# Patient Record
Sex: Male | Born: 1990
Health system: Southern US, Community
[De-identification: ages and names within clinical notes are randomized; demographics above are authoritative.]

---

## 2013-02-06 ENCOUNTER — Emergency Department (HOSPITAL_COMMUNITY)
Admission: EM | Admit: 2013-02-06 | Discharge: 2013-02-06 | Disposition: A | Discharge status: Home / Self Care | Payer: BC Managed Care – PPO | Attending: Emergency Medicine | Admitting: Emergency Medicine

## 2013-02-06 ENCOUNTER — Encounter (HOSPITAL_COMMUNITY): Payer: Self-pay | Admitting: *Deleted

## 2013-02-06 DIAGNOSIS — H18821 Corneal disorder due to contact lens, right eye: Secondary | ICD-10-CM

## 2013-02-06 DIAGNOSIS — Y939 Activity, unspecified: Secondary | ICD-10-CM | POA: Insufficient documentation

## 2013-02-06 DIAGNOSIS — S058X9A Other injuries of unspecified eye and orbit, initial encounter: Secondary | ICD-10-CM | POA: Insufficient documentation

## 2013-02-06 DIAGNOSIS — X58XXXA Exposure to other specified factors, initial encounter: Secondary | ICD-10-CM | POA: Insufficient documentation

## 2013-02-06 DIAGNOSIS — Y929 Unspecified place or not applicable: Secondary | ICD-10-CM | POA: Insufficient documentation

## 2013-02-06 MED ORDER — TETRACAINE HCL 0.5 % OP SOLN
2.0000 [drp] | Freq: Once | OPHTHALMIC | Status: AC
  Administered 2013-02-06: 2 [drp] via OPHTHALMIC
  Filled 2013-02-06: qty 2

## 2013-02-06 MED ORDER — MOXIFLOXACIN HCL 0.5 % OP SOLN: 1.0000 [drp] | OPHTHALMIC | Status: DC

## 2013-02-06 MED ORDER — FLUORESCEIN SODIUM 1 MG OP STRP
1.0000 | ORAL_STRIP | Freq: Once | OPHTHALMIC | Status: AC
  Administered 2013-02-06: 1 via OPHTHALMIC
  Filled 2013-02-06: qty 1

## 2013-02-06 NOTE — ED Provider Notes (Signed)
CSN: [id number]     Arrival date & time 02/06/13  1612 History  This chart was scribed for non-physician practitioner Dierdre Forth, PA-C working with Candyce Churn, MD by Danella Maiers, ED Scribe. This patient was seen in room TR04C/TR04C and the patient's care was started at 5:08 PM.    Chief Complaint  Patient presents with  . Conjunctivitis   The history is provided by the patient. No language interpreter was used.   HPI Comments: Marcus Hanson is a 22 y.o. male who presents to the Emergency Department complaining of redness and clear, watery discharge from the right eye that started yesterday. He also reports mild itchiness without tenderness. He reports two episodes of clear discharge. He states it was a little bit crusty this morning but not glued shut. He has never had conjunctivitis before. He has not tried any medications. He notes he does keep his contacts in for prolonged periods and sleeps in them even though he is advised to take them out. He denies any scratchy pain as if something is stuck in the eye. He denies visual disturbance, photophobia, eye pain, nausea, emesis, fever, chills. He denies recent cold sores. He has denies any injuries. He denies anyone in the house having conjunctivitis. The last time he saw his eye doctor, Dr. Laney Pastor, in [place] was in March. He is planning to go in the next week.    History reviewed. No pertinent past medical history. History reviewed. No pertinent past surgical history. No family history on file. History  Substance Use Topics  . Smoking status: Never Smoker   . Smokeless tobacco: Not on file  . Alcohol Use: No    Review of Systems  Constitutional: Negative for fever, diaphoresis, appetite change, fatigue and unexpected weight change.  HENT: Negative for mouth sores and neck stiffness.   Eyes: Positive for discharge and redness. Negative for photophobia, pain and visual disturbance.  Respiratory: Negative for cough,  chest tightness, shortness of breath and wheezing.   Cardiovascular: Negative for chest pain.  Gastrointestinal: Negative for nausea, vomiting, abdominal pain, diarrhea and constipation.  Endocrine: Negative for polydipsia, polyphagia and polyuria.  Genitourinary: Negative for dysuria, urgency, frequency and hematuria.  Musculoskeletal: Negative for back pain.  Skin: Negative for rash.  Allergic/Immunologic: Negative for immunocompromised state.  Neurological: Negative for syncope, light-headedness and headaches.  Hematological: Does not bruise/bleed easily.  Psychiatric/Behavioral: Negative for sleep disturbance. The patient is not nervous/anxious.     Allergies  Review of patient's allergies indicates no known allergies.  Home Medications   Current Outpatient Rx  Name  Route  Sig  Dispense  Refill  . moxifloxacin (VIGAMOX) 0.5 % ophthalmic solution   Right Eye   Place 1 drop into the right eye See admin instructions. Instill 1-2 drops every 2 hours while awake for 2 days THEN every 4-8 hours for 5 days   3 mL   0    BP 133/88  Pulse 77  Temp(Src) 98.7 F (37.1 C) (Oral)  Resp 16  SpO2 98% Physical Exam  Nursing note and vitals reviewed. Constitutional: He is oriented to person, place, and time. He appears well-developed and well-nourished. No distress.  Awake, alert, nontoxic appearance  HENT:  Head: Normocephalic and atraumatic.  Mouth/Throat: Oropharynx is clear and moist. No oropharyngeal exudate.  Eyes: EOM and lids are normal. Pupils are equal, round, and reactive to light. Lids are everted and swept, no foreign bodies found. Right eye exhibits no chemosis, no discharge, no exudate  and no hordeolum. No foreign body present in the right eye. Left eye exhibits no chemosis, no discharge, no exudate and no hordeolum. No foreign body present in the left eye. Right conjunctiva is injected. Right conjunctiva has no hemorrhage. Left conjunctiva is not injected. Left conjunctiva  has no hemorrhage. No scleral icterus. Right eye exhibits normal extraocular motion and no nystagmus. Left eye exhibits normal extraocular motion and no nystagmus.  Fundoscopic exam:      The right eye shows no arteriolar narrowing, no AV nicking, no exudate, no hemorrhage and no papilledema.       The left eye shows no arteriolar narrowing, no AV nicking, no exudate, no hemorrhage and no papilledema.  Slit lamp exam:      The right eye shows corneal abrasion and fluorescein uptake. The right eye shows no corneal flare, no corneal ulcer, no foreign body and no anterior chamber bulge.       The left eye shows no corneal abrasion, no corneal flare, no corneal ulcer, no foreign body, no fluorescein uptake and no anterior chamber bulge.    Currently no discharge from either eye. Right eye is injected. Small corneal abrasion to the right eye  Visual Acuity without contacts or glasses: Both eye - 20/20 Right eye - 20/40 Left eye - 20/25  Neck: Normal range of motion and full passive range of motion without pain. No spinous process tenderness and no muscular tenderness present. No rigidity. Normal range of motion present.  No midline tenderness, no nuchal rigidity  Cardiovascular: Normal rate, regular rhythm, normal heart sounds and intact distal pulses.   No murmur heard. Pulmonary/Chest: Effort normal and breath sounds normal. No respiratory distress. He has no decreased breath sounds. He has no wheezes.  Abdominal: Soft. Bowel sounds are normal. He exhibits no mass. There is no tenderness. There is no rebound and no guarding.  Musculoskeletal: Normal range of motion. He exhibits no edema.  Lymphadenopathy:       Head (right side): No submental, no submandibular, no tonsillar, no preauricular, no posterior auricular and no occipital adenopathy present.       Head (left side): No submental, no submandibular, no tonsillar, no preauricular, no posterior auricular and no occipital adenopathy present.        Right cervical: No superficial cervical, no deep cervical and no posterior cervical adenopathy present.      Left cervical: No superficial cervical, no deep cervical and no posterior cervical adenopathy present.  Neurological: He is alert and oriented to person, place, and time. No cranial nerve deficit.  Speech is clear and goal oriented Moves extremities without ataxia Cranial nerves 2 through 12 grossly intact  Skin: Skin is warm and dry. No rash noted. He is not diaphoretic. No erythema.  No erythema surrounding the right eye No periorbital edema  Psychiatric: He has a normal mood and affect.    ED Course  Procedures (including critical care time) Medications  tetracaine (PONTOCAINE) 0.5 % ophthalmic solution 2 drop (2 drops Right Eye Given 02/06/13 1735)  fluorescein ophthalmic strip 1 strip (1 strip Both Eyes Given 02/06/13 1735)   DIAGNOSTIC STUDIES: Oxygen Saturation is 98% on RA, normal by my interpretation.    COORDINATION OF CARE: 5:49 PM- Discussed treatment plan with pt which includes referral to ophthalmologist and pt agrees to plan.   Labs Review Labs Reviewed - No data to display Imaging Review No results found.  MDM   1. Corneal abrasion of right eye due to contact  lens       Tymir Terral presents with corneal abrasion 2/2 contact use.  Pt with corneal abrasion on PE. Eye irrigated w NS, no evidence of FB.  No change in vision, acuity equal bilaterally.  Pt is a contact lens wearer and this is likely the source of his abrasion given the location.  Exam non-concerning for orbital cellulitis, hyphema, corneal ulcers. Patient will be discharged home with moxifloxacin opthalmic.   Patient understands to follow up with ophthalmology, & to return to ER if new symptoms develop including change in vision, purulent drainage, or entrapment.  It has been determined that no acute conditions requiring further emergency intervention are present at this time. The  patient/guardian have been advised of the diagnosis and plan. We have discussed signs and symptoms that warrant return to the ED, such as changes or worsening in symptoms.   Vital signs are stable at discharge.   BP 138/90  Pulse 75  Temp(Src) 98.7 F (37.1 C) (Oral)  Resp 16  SpO2 100%  Patient/guardian has voiced understanding and agreed to follow-up with the PCP or specialist.    I personally performed the services described in this documentation, which was scribed in my presence. The recorded information has been reviewed and is accurate.    Dierdre Forth, PA-C 02/06/13 1858

## 2013-02-06 NOTE — ED Notes (Signed)
Visual acuity screening: Both eye - 20/20 Right eye - 20/40 Left eye - 20/25

## 2013-02-06 NOTE — ED Notes (Signed)
Pt with redness, yellow discharge and some pain to R eye

## 2013-02-07 NOTE — ED Provider Notes (Signed)
Medical screening examination/treatment/procedure(s) were performed by non-physician practitioner and as supervising physician I was immediately available for consultation/collaboration.    Candyce Churn, MD 02/07/13 [phone]

## 2013-09-19 ENCOUNTER — Encounter (HOSPITAL_COMMUNITY): Payer: Self-pay | Admitting: Emergency Medicine

## 2013-09-19 ENCOUNTER — Ambulatory Visit: Payer: 59

## 2013-09-19 ENCOUNTER — Emergency Department (HOSPITAL_COMMUNITY)
Admission: EM | Admit: 2013-09-19 | Discharge: 2013-09-19 | Disposition: A | Discharge status: Home / Self Care | Payer: 59 | Attending: Emergency Medicine | Admitting: Emergency Medicine

## 2013-09-19 DIAGNOSIS — R599 Enlarged lymph nodes, unspecified: Secondary | ICD-10-CM | POA: Insufficient documentation

## 2013-09-19 DIAGNOSIS — L089 Local infection of the skin and subcutaneous tissue, unspecified: Secondary | ICD-10-CM | POA: Insufficient documentation

## 2013-09-19 MED ORDER — TERBINAFINE HCL 1 % EX CREA: 1.0000 | TOPICAL_CREAM | Freq: Two times a day (BID) | CUTANEOUS | Status: DC

## 2013-09-19 MED ORDER — CEPHALEXIN 500 MG PO CAPS: 500.0000 mg | ORAL_CAPSULE | Freq: Three times a day (TID) | ORAL | Status: DC

## 2013-09-19 NOTE — ED Provider Notes (Signed)
CSN: [id number]     Arrival date & time 09/19/13  1144 History  This chart was scribed for non-physician practitioner, Arthor CaptainAbigail Faiga Stones, PA-C working with Doug SouSam Jacubowitz, MD by Greggory StallionKayla Andersen, ED scribe. This patient was seen in room TR09C/TR09C and the patient's care was started at 1:11 PM.   Chief Complaint  Patient presents with  . Otalgia   The history is provided by the patient. No language interpreter was used.   HPI Comments: Marcus Hanson is a 23 y.o. male who presents to the Emergency Department complaining of a knot behind his right ear that started about one month ago. States it has been very dry so he has been using Vasolin on it with little relief. The pain is radiating into his ear and it is sometimes itchy. He has noticed some drainage that worsened a few days ago. Denies fever, hearing changes, neck pain.   History reviewed. No pertinent past medical history. History reviewed. No pertinent past surgical history. History reviewed. No pertinent family history. History  Substance Use Topics  . Smoking status: Never Smoker   . Smokeless tobacco: Not on file  . Alcohol Use: Yes    Review of Systems  Constitutional: Negative for fever.  HENT: Positive for ear pain. Negative for hearing loss.   Eyes: Negative for redness.  Respiratory: Negative for shortness of breath.   Cardiovascular: Negative for chest pain.  Gastrointestinal: Negative for abdominal distention.  Musculoskeletal: Negative for neck pain.  Skin: Negative for rash.  Neurological: Negative for speech difficulty.  Psychiatric/Behavioral: Negative for confusion.   Allergies  Review of patient's allergies indicates no known allergies.  Home Medications   Prior to Admission medications   Medication Sig Start Date End Date Taking? Authorizing Provider  moxifloxacin (VIGAMOX) 0.5 % ophthalmic solution Place 1 drop into the right eye See admin instructions. Instill 1-2 drops every 2 hours while awake for 2 days  THEN every 4-8 hours for 5 days 02/06/13   Dahlia ClientHannah Muthersbaugh, PA-C   BP 142/78  Pulse 83  Temp(Src) 98.2 F (36.8 C) (Oral)  Resp 18  SpO2 100%  Physical Exam  Nursing note and vitals reviewed. Constitutional: He is oriented to person, place, and time. He appears well-developed and well-nourished. No distress.  HENT:  Head: Normocephalic and atraumatic.  Thick, yellow crusting and scaling on posterior right ear.  Eyes: EOM are normal.  Neck: Neck supple. No tracheal deviation present.  Cardiovascular: Normal rate.   Pulmonary/Chest: Effort normal. No respiratory distress.  Musculoskeletal: Normal range of motion.  Lymphadenopathy:       Head (left side): Posterior auricular adenopathy present.  Neurological: He is alert and oriented to person, place, and time.  Skin: Skin is warm and dry.  Psychiatric: He has a normal mood and affect. His behavior is normal.    ED Course  Procedures (including critical care time)  DIAGNOSTIC STUDIES: Oxygen Saturation is 100% on RA, normal by my interpretation.    COORDINATION OF CARE: 1:14 PM-Discussed treatment plan  with pt at bedside and pt agreed to plan.   Labs Review Labs Reviewed - No data to display  Imaging Review No results found.   EKG Interpretation None      MDM   Final diagnoses:  Skin infection    Patient with weeping skin infection posterior to R ear. I  believe the infection as fungal however question secondary bacterial infection with possible impetigo. Plan patient will be discharged today with Lamisil and Keflex for infection.  Given the patient was prescribed for followup. No signs of mastoiditis no acute otitis media or other signs of infection.  I personally performed the services described in this documentation, which was scribed in my presence. The recorded information has been reviewed and is accurate.  Arthor CaptainAbigail Zenia Guest, PA-C 09/19/13 1443

## 2013-09-19 NOTE — ED Provider Notes (Signed)
Medical screening examination/treatment/procedure(s) were performed by non-physician practitioner and as supervising physician I was immediately available for consultation/collaboration.   EKG Interpretation None       Albeiro Trompeter, MD 09/19/13 1626 

## 2013-09-19 NOTE — ED Notes (Signed)
Pt reports that he has had problems with drainage from a knot behind his right ear. Pt states that over the past couple of the drainage has become worse.

## 2013-09-19 NOTE — Discharge Instructions (Signed)
Candida Infection, Adult A candida infection (also called yeast, fungus and Monilia infection) is an overgrowth of yeast that can occur anywhere on the body. A yeast infection commonly occurs in warm, moist body areas. Usually, the infection remains localized but can spread to become a systemic infection. A yeast infection may be a sign of a more severe disease such as diabetes, leukemia, or AIDS. A yeast infection can occur in both men and women. In women, Candida vaginitis is a vaginal infection. It is one of the most common causes of vaginitis. Men usually do not have symptoms or know they have an infection until other problems develop. Men may find out they have a yeast infection because their sex partner has a yeast infection. Uncircumcised men are more likely to get a yeast infection than circumcised men. This is because the uncircumcised glans is not exposed to air and does not remain as dry as that of a circumcised glans. Older adults may develop yeast infections around dentures. CAUSES  Women  Antibiotics.  Steroid medication taken for a long time.  Being overweight (obese).  Diabetes.  Poor immune condition.  Certain serious medical conditions.  Immune suppressive medications for organ transplant patients.  Chemotherapy.  Pregnancy.  Menstration.  Stress and fatigue.  Intravenous drug use.  Oral contraceptives.  Wearing tight-fitting clothes in the crotch area.  Catching it from a sex partner who has a yeast infection.  Spermicide.  Intravenous, urinary, or other catheters. Men  Catching it from a sex partner who has a yeast infection.  Having oral or anal sex with a person who has the infection.  Spermicide.  Diabetes.  Antibiotics.  Poor immune system.  Medications that suppress the immune system.  Intravenous drug use.  Intravenous, urinary, or other catheters. SYMPTOMS  Women  Thick, white vaginal discharge.  Vaginal itching.  Redness and  swelling in and around the vagina.  Irritation of the lips of the vagina and perineum.  Blisters on the vaginal lips and perineum.  Painful sexual intercourse.  Low blood sugar (hypoglycemia).  Painful urination.  Bladder infections.  Intestinal problems such as constipation, indigestion, bad breath, bloating, increase in gas, diarrhea, or loose stools. Men  Men may develop intestinal problems such as constipation, indigestion, bad breath, bloating, increase in gas, diarrhea, or loose stools.  Dry, cracked skin on the penis with itching or discomfort.  Jock itch.  Dry, flaky skin.  Athlete's foot.  Hypoglycemia. DIAGNOSIS  Women  A history and an exam are performed.  The discharge may be examined under a microscope.  A culture may be taken of the discharge. Men  A history and an exam are performed.  Any discharge from the penis or areas of cracked skin will be looked at under the microscope and cultured.  Stool samples may be cultured. TREATMENT  Women  Vaginal antifungal suppositories and creams.  Medicated creams to decrease irritation and itching on the outside of the vagina.  Warm compresses to the perineal area to decrease swelling and discomfort.  Oral antifungal medications.  Medicated vaginal suppositories or cream for repeated or recurrent infections.  Wash and dry the irritation areas before applying the cream.  Eating yogurt with lactobacillus may help with prevention and treatment.  Sometimes painting the vagina with gentian violet solution may help if creams and suppositories do not work. Men  Antifungal creams and oral antifungal medications.  Sometimes treatment must continue for 30 days after the symptoms go away to prevent recurrence. HOME CARE   INSTRUCTIONS  Women  Use cotton underwear and avoid tight-fitting clothing.  Avoid colored, scented toilet paper and deodorant tampons or pads.  Do not douche.  Keep your diabetes  under control.  Finish all the prescribed medications.  Keep your skin clean and dry.  Consume milk or yogurt with lactobacillus active culture regularly. If you get frequent yeast infections and think that is what the infection is, there are over-the-counter medications that you can get. If the infection does not show healing in 3 days, talk to your caregiver.  Tell your sex partner you have a yeast infection. Your partner may need treatment also, especially if your infection does not clear up or recurs. Men  Keep your skin clean and dry.  Keep your diabetes under control.  Finish all prescribed medications.  Tell your sex partner that you have a yeast infection so they can be treated if necessary. SEEK MEDICAL CARE IF:   Your symptoms do not clear up or worsen in one week after treatment.  You have an oral temperature above 102 F (38.9 C).  You have trouble swallowing or eating for a prolonged time.  You develop blisters on and around your vagina.  You develop vaginal bleeding and it is not your menstrual period.  You develop abdominal pain.  You develop intestinal problems as mentioned above.  You get weak or lightheaded.  You have painful or increased urination.  You have pain during sexual intercourse. MAKE SURE YOU:   Understand these instructions.  Will watch your condition.  Will get help right away if you are not doing well or get worse. Document Released: 06/01/2004 Document Revised: 07/17/2011 Document Reviewed: 09/13/2009 [facility] CenterExitCare Patient Information 2014 [place], [place].  Impetigo Impetigo is an infection of the skin, most common in babies and children.  CAUSES  It is caused by staphylococcal or streptococcal germs (bacteria). Impetigo can start after any damage to the skin. The damage to the skin may be from things like:   Chickenpox.  Scrapes.  Scratches.  Insect bites (common when children scratch the bite).  Cuts.  Nail biting or  chewing. Impetigo is contagious. It can be spread from one person to another. Avoid close skin contact, or sharing towels or clothing. SYMPTOMS  Impetigo usually starts out as small blisters or pustules. Then they turn into tiny yellow-crusted sores (lesions).  There may also be:  Large blisters.  Itching or pain.  Pus.  Swollen lymph glands. With scratching, irritation, or non-treatment, these small areas may get larger. Scratching can cause the germs to get under the fingernails; then scratching another part of the skin can cause the infection to be spread there. DIAGNOSIS  Diagnosis of impetigo is usually made by a physical exam. A skin culture (test to grow bacteria) may be done to prove the diagnosis or to help decide the best treatment.  TREATMENT  Mild impetigo can be treated with prescription antibiotic cream. Oral antibiotic medicine may be used in more severe cases. Medicines for itching may be used. HOME CARE INSTRUCTIONS   To avoid spreading impetigo to other body areas:  Keep fingernails short and clean.  Avoid scratching.  Cover infected areas if necessary to keep from scratching.  Gently wash the infected areas with antibiotic soap and water.  Soak crusted areas in warm soapy water using antibiotic soap.  Gently rub the areas to remove crusts. Do not scrub.  Wash hands often to avoid spread this infection.  Keep children with impetigo home from school  or daycare until they have used an antibiotic cream for 48 hours (2 days) or oral antibiotic medicine for 24 hours (1 day), and their skin shows significant improvement.  Children may attend school or daycare if they only have a few sores and if the sores can be covered by a bandage or clothing. SEEK MEDICAL CARE IF:   More blisters or sores show up despite treatment.  Other family members get sores.  Rash is not improving after 48 hours (2 days) of treatment. SEEK IMMEDIATE MEDICAL CARE IF:   You see  spreading redness or swelling of the skin around the sores.  You see red streaks coming from the sores.  Your child develops a fever of 100.4 F (37.2 C) or higher.  Your child develops a sore throat.  Your child is acting ill (lethargic, sick to their stomach). Document Released: 04/21/2000 Document Revised: 07/17/2011 Document Reviewed: 02/19/2008 [facility] LPExitCare Patient Information 2014 [place]ExitCare, [place]LL[place].

## 2016-02-02 ENCOUNTER — Ambulatory Visit (INDEPENDENT_AMBULATORY_CARE_PROVIDER_SITE_OTHER): Payer: 59 | Admitting: Family Medicine

## 2016-02-02 VITALS — BP 136/87 | HR 98 | Temp 98.8°F | Resp 16 | Ht 65.0 in | Wt 219.0 lb

## 2016-02-02 DIAGNOSIS — Z0000 Encounter for general adult medical examination without abnormal findings: Secondary | ICD-10-CM

## 2016-02-02 DIAGNOSIS — Z23 Encounter for immunization: Secondary | ICD-10-CM | POA: Diagnosis not present

## 2016-02-02 NOTE — Patient Instructions (Signed)
It was good to meet you today.   Sign up for Mychart when you get home and you will have access to your labs.  LDL = cholesterol A1C = blood sugar TSH = thyroid (concerns for weight) Your BMI is 36.   Your blood pressure is 136/87.

## 2016-02-02 NOTE — Progress Notes (Signed)
    Marcus Hanson is a 25 y.o. male who presents to [facility] today for comprehensive physical examination:  CPE:  Patient here for complete physical examination. He has forms that he needs filled out for work. He needs to have an LDL, A1c, BMI for work.  Concerns:  None, doing well.   Patient originally from [place]. Now lives and works in [place]. Has been working for [employer] which is an [employer] company for the past 2 years.  Family history hypertension and diabetes is grandparents both maternally and paternally. Otherwise negative family history.  He drinks socially 1-2 drinks a week. Otherwise no cigarettes or illicit drug use. He is single and not currently sexually active. No history or concern for STDs.  Last physical never.  Last Tetanus more than 10 years ago.   Flu vaccine never. Eye exam never.;  No glasses. Dental exam every six months.  ROS:  The patient denies fever, unusual weight change, decreased hearing, chest pain, palpitations, pre-syncopal or syncopal episodes, dyspnea on exertion, prolonged cough, hemoptysis, change in bowel habits, melena, hematochezia, severe indigestion/heartburn, nausea/vomiting/abdominal pain, genital sores, muscle weakness, difficulty walking, abnormal bleeding, or enlarged lymph nodes.     PMH reviewed. Patient is a nonsmoker.   History reviewed. No pertinent past medical history. History reviewed. No pertinent surgical history.  Medications reviewed. No current outpatient prescriptions on file.   No current facility-administered medications for this visit.     Exam: BP 136/87 (BP Location: Right Arm, Patient Position: Sitting, Cuff Size: Normal)   Pulse 98   Temp 98.8 F (37.1 C) (Oral)   Resp 16   Ht 5\' 5"  (1.651 m)   Wt 219 lb (99.3 kg)   SpO2 98%   BMI 36.44 kg/m  Gen:  Alert, cooperative patient who appears stated age in no acute distress.  Vital signs reviewed. Head: [place]/AT.   Eyes:   EOMI, PERRL.   Ears:  External ears WNL, Bilateral TM's normal without retraction, redness or bulging. Nose:  Septum midline  Mouth:  MMM, tonsils non-erythematous, non-edematous.   Neck: No masses or thyromegaly or limitation in range of motion.  No cervical lymphadenopathy. Pulm:  Clear to auscultation bilaterally with good air movement.  No wheezes or rales noted.   Cardiac:  Regular rate and rhythm without murmur auscultated.  Good S1/S2. Abd:  Soft/nondistended/nontender.  Good bowel sounds throughout all four quadrants.  No masses noted.  Ext:  No clubbing/cyanosis/erythema.  No edema noted bilateral lower extremities.   Neuro:  Grossly normal, no gait abnormalities Psych:  Not depressed or anxious appearing.  Conversant and engaged  Impression/Plan: 1. Complete Physical Examination: anticipatory guidance provided.  S/p TDAP.   2.  STD screening/high risk sexual behavior: counseling provided; obtain GC/Chlam/RPR/HIV. 3.  Vaccines:  S/p TDAP 4.  Screening cholesterol: obtain FLP. 5.  Overweight:  Discussed with patient.  He feels this is come on fairly suddenly in the past year. Does report sedentary lifestyle. Also checking TSH for weight gain.  Believe this is likely NSAID secondary to sedentary lifestyle and increased caloric intake. We'll call with results.

## 2016-02-03 LAB — TSH: TSH: 1.79 m[IU]/L (ref 0.40–4.50)

## 2016-02-03 LAB — HEMOGLOBIN A1C
Hgb A1c MFr Bld: 5.3 % (ref <5.7)
MEAN PLASMA GLUCOSE: 105 mg/dL

## 2016-02-03 LAB — LDL CHOLESTEROL, DIRECT: LDL DIRECT: 174 mg/dL — AB (ref <130)

## 2016-02-08 ENCOUNTER — Telehealth: Payer: Self-pay

## 2016-02-14 NOTE — Telephone Encounter (Signed)
There is no content to this message. Not sure if opened in error, or if pt tried to call us. LMOM for pt to CB if he was trying to reach us and I will be happy to help him.

## 2017-08-27 ENCOUNTER — Encounter (HOSPITAL_COMMUNITY): Payer: Self-pay

## 2017-08-27 ENCOUNTER — Emergency Department (HOSPITAL_COMMUNITY)
Admission: EM | Admit: 2017-08-27 | Discharge: 2017-08-27 | Disposition: A | Discharge status: Home / Self Care | Payer: 59 | Attending: Emergency Medicine | Admitting: Emergency Medicine

## 2017-08-27 DIAGNOSIS — H10022 Other mucopurulent conjunctivitis, left eye: Secondary | ICD-10-CM | POA: Diagnosis not present

## 2017-08-27 DIAGNOSIS — H5789 Other specified disorders of eye and adnexa: Secondary | ICD-10-CM | POA: Diagnosis present

## 2017-08-27 DIAGNOSIS — H1032 Unspecified acute conjunctivitis, left eye: Secondary | ICD-10-CM

## 2017-08-27 MED ORDER — LEVOFLOXACIN 0.5 % OP SOLN: 1.0000 [drp] | Freq: Four times a day (QID) | OPHTHALMIC | 0 refills | Status: AC

## 2017-08-27 NOTE — ED Provider Notes (Signed)
[facility] EMERGENC[facility] Provider Note   CSN: [id number] Arrival date & time: 08/27/17  [id number]     History   Chief Complaint Chief Complaint  Patient presents with  . Conjunctivitis    HPI Marcus KnifeDennis J Iwan Jr. is a 27 y.o. male.  HPI  Patient presents to the emergency department with left eye redness and drainage since Saturday.  The patient states that he does wear contact lenses and noticed on Friday evening that his eyes felt a little irritated on the left.  He states that he is not worn the contacts since then.  Patient states that nothing seems to make the condition better or worse.  Patient denies any headache blurred vision nausea vomiting weakness dizziness or syncope.    History reviewed. No pertinent past medical history.Patient presents to the emergency department with  There are no active problems to display for this patient.   History reviewed. No pertinent surgical history.      Home Medications    Prior to Admission medications   Not on File    Family History No family history on file.  Social History Social History   Tobacco Use  . Smoking status: Never Smoker  . Smokeless tobacco: Never Used  Substance Use Topics  . Alcohol use: Yes  . Drug use: No     Allergies   Patient has no known allergies.   Review of Systems Review of Systems All other systems negative except as documented in the HPI. All pertinent positives and negatives as reviewed in the HPI.  Physical Exam Updated Vital Signs BP (!) 149/98 (BP Location: Right Arm)   Pulse 72   Temp 98.2 F (36.8 C) (Oral)   Resp 16   Ht 5\' 6"  (1.676 m)   Wt 85.7 kg (189 lb)   SpO2 100%   BMI 30.51 kg/m   Physical Exam  Constitutional: He is oriented to person, place, and time. He appears well-developed and well-nourished. No distress.  HENT:  Head: Normocephalic and atraumatic.  Eyes: Pupils are equal, round, and reactive to light. EOM and lids are  normal. Lids are everted and swept, no foreign bodies found. Left eye exhibits no discharge and no exudate. No foreign body present in the left eye. Left conjunctiva is injected. Left conjunctiva has no hemorrhage.  Pulmonary/Chest: Effort normal.  Neurological: He is alert and oriented to person, place, and time.  Skin: Skin is warm and dry.  Psychiatric: He has a normal mood and affect.  Nursing note and vitals reviewed.    ED Treatments / Results  Labs (all labs ordered are listed, but only abnormal results are displayed) Labs Reviewed - No data to display  EKG None  Radiology No results found.  Procedures Procedures (including critical care time)  Medications Ordered in ED Medications - No data to display   Initial Impression / Assessment and Plan / ED Course  I have reviewed the triage vital signs and the nursing notes.  Pertinent labs & imaging results that were available during my care of the patient were reviewed by me and considered in my medical decision making (see chart for details).     Patient states he does have an eye doctor's appointment at the end of the week and I advised him that I would call them to see if they can see him any sooner.  If not I feel that this would be sufficient for follow-up.  I did advise him to return for  any worsening in his condition patient agrees the plan and all questions were answered.  I advised the patient to throw out the contact lenses that he was last wearing.  And I told him not to wear contacts until his eyes completely cleared.  Final Clinical Impressions(s) / ED Diagnoses   Final diagnoses:  None    ED Discharge Orders    None       Charlestine Night, PA-C 08/27/17 0900    Arby Barrette, MD 09/03/17 1009

## 2017-08-27 NOTE — ED Triage Notes (Signed)
Pt reports left eye redness, drainage, crust since Saturday. Denies pain or visual changes. Endorses itching.

## 2017-08-27 NOTE — Discharge Instructions (Addendum)
Follow-up with your ophthalmologist as soon as possible.  Return here for any worsening in your condition. finish the full 7 days of drops.

## 2017-12-23 ENCOUNTER — Emergency Department (HOSPITAL_COMMUNITY): Payer: No Typology Code available for payment source

## 2017-12-23 ENCOUNTER — Encounter (HOSPITAL_COMMUNITY): Payer: Self-pay | Admitting: Nurse Practitioner

## 2017-12-23 ENCOUNTER — Emergency Department (HOSPITAL_COMMUNITY)
Admission: EM | Admit: 2017-12-23 | Discharge: 2017-12-24 | Disposition: A | Discharge status: Home / Self Care | Payer: No Typology Code available for payment source | Attending: Emergency Medicine | Admitting: Emergency Medicine

## 2017-12-23 DIAGNOSIS — Y999 Unspecified external cause status: Secondary | ICD-10-CM | POA: Diagnosis not present

## 2017-12-23 DIAGNOSIS — M25512 Pain in left shoulder: Secondary | ICD-10-CM

## 2017-12-23 DIAGNOSIS — Y9389 Activity, other specified: Secondary | ICD-10-CM | POA: Diagnosis not present

## 2017-12-23 DIAGNOSIS — S4992XA Unspecified injury of left shoulder and upper arm, initial encounter: Secondary | ICD-10-CM | POA: Diagnosis present

## 2017-12-23 DIAGNOSIS — G8911 Acute pain due to trauma: Secondary | ICD-10-CM | POA: Diagnosis not present

## 2017-12-23 DIAGNOSIS — I10 Essential (primary) hypertension: Secondary | ICD-10-CM | POA: Diagnosis not present

## 2017-12-23 DIAGNOSIS — Y92414 Local residential or business street as the place of occurrence of the external cause: Secondary | ICD-10-CM | POA: Diagnosis not present

## 2017-12-23 MED ORDER — IBUPROFEN 800 MG PO TABS
800.0000 mg | ORAL_TABLET | Freq: Once | ORAL | Status: AC
  Administered 2017-12-23: 800 mg via ORAL
  Filled 2017-12-23: qty 1

## 2017-12-23 MED ORDER — METHOCARBAMOL 500 MG PO TABS
500.0000 mg | ORAL_TABLET | Freq: Once | ORAL | Status: AC
  Administered 2017-12-23: 500 mg via ORAL
  Filled 2017-12-23: qty 1
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
## 2017-12-24 DIAGNOSIS — M25512 Pain in left shoulder: Secondary | ICD-10-CM | POA: Diagnosis not present

## 2017-12-24 MED ORDER — DIPHENHYDRAMINE HCL 25 MG PO CAPS
ORAL_CAPSULE | ORAL | Status: AC
  Administered 2017-12-24: 50 mg via ORAL
  Filled 2017-12-24: qty 2

## 2017-12-24 MED ORDER — METHOCARBAMOL 500 MG PO TABS: 500.0000 mg | ORAL_TABLET | Freq: Two times a day (BID) | ORAL | 0 refills | Status: DC

## 2017-12-24 MED ORDER — DIPHENHYDRAMINE HCL 25 MG PO CAPS
50.0000 mg | ORAL_CAPSULE | Freq: Once | ORAL | Status: AC
  Administered 2017-12-24: 50 mg via ORAL

## 2017-12-24 MED ORDER — NAPROXEN 500 MG PO TABS: 500.0000 mg | ORAL_TABLET | Freq: Two times a day (BID) | ORAL | 0 refills | Status: DC

## 2017-12-24 NOTE — Discharge Instructions (Addendum)

## 2018-04-25 ENCOUNTER — Emergency Department (HOSPITAL_COMMUNITY)
Admission: EM | Admit: 2018-04-25 | Discharge: 2018-04-25 | Disposition: A | Discharge status: Home / Self Care | Payer: 59 | Attending: Emergency Medicine | Admitting: Emergency Medicine

## 2018-04-25 ENCOUNTER — Encounter (HOSPITAL_COMMUNITY): Payer: Self-pay | Admitting: *Deleted

## 2018-04-25 ENCOUNTER — Other Ambulatory Visit: Payer: Self-pay

## 2018-04-25 ENCOUNTER — Emergency Department (HOSPITAL_COMMUNITY): Payer: 59

## 2018-04-25 DIAGNOSIS — R0789 Other chest pain: Secondary | ICD-10-CM | POA: Insufficient documentation

## 2018-04-25 MED ORDER — METHOCARBAMOL 500 MG PO TABS: 500.0000 mg | ORAL_TABLET | Freq: Two times a day (BID) | ORAL | 0 refills | Status: AC

## 2018-04-25 MED ORDER — IBUPROFEN 600 MG PO TABS: 600.0000 mg | ORAL_TABLET | Freq: Four times a day (QID) | ORAL | 0 refills | Status: AC | PRN

## 2018-04-25 MED ORDER — KETOROLAC TROMETHAMINE 60 MG/2ML IM SOLN
60.0000 mg | Freq: Once | INTRAMUSCULAR | Status: AC
  Administered 2018-04-25: 60 mg via INTRAMUSCULAR
  Filled 2018-04-25: qty 2

## 2018-04-25 NOTE — ED Provider Notes (Signed)
[facility] Provider Note   CSN: [id number] Arrival date & time: 04/25/18  [id number]     History   Chief Complaint Chief Complaint  Patient presents with  . lt lateral chest pain junst uncder his lt arm    HPI Marcus KnifeDennis J Fetherolf Jr. is a 27 y.o. male with no pertinent past medical history who presents to the emergency department with a chief complaint of chest pain.  The patient endorses pain to the left chest wall that began 1 to 2 weeks ago as intermittent pain, left-sided pain that has become constant over the last few days.  He states the pain is worse with twisting and with certain positional changes.  He reports that he performs a lot of repetitive tasks at his job.  He has noted the pain has been worse at the end of a shift.  No history of similar.  He denies new any new injuries, exercises, or activities.  No fevers, chills, cough, shortness of breath, palpitations, numbness, weakness, rash, urinary symptoms, abdominal pain, nausea, vomiting, or diarrhea.  He reports that he had a massage his upper shoulders earlier this week without improvement in his symptoms.  No other treatment prior to arrival.  No chronic medical conditions or daily medications.  The history is provided by the patient. No language interpreter was used.    History reviewed. No pertinent past medical history.  There are no active problems to display for this patient.   History reviewed. No pertinent surgical history.      Home Medications    Prior to Admission medications   Medication Sig Start Date End Date Taking? Authorizing Provider  ibuprofen (ADVIL,MOTRIN) 600 MG tablet Take 1 tablet (600 mg total) by mouth every 6 (six) hours as needed. 04/25/18   McDonald, Mia A, PA-C  methocarbamol (ROBAXIN) 500 MG tablet Take 1 tablet (500 mg total) by mouth 2 (two) times daily. 04/25/18   McDonald, Pedro EarlsMia A, PA-C    Family History No family history on file.  Social  History Social History   Tobacco Use  . Smoking status: Never Smoker  . Smokeless tobacco: Never Used  Substance Use Topics  . Alcohol use: Yes  . Drug use: No     Allergies   Patient has no known allergies.   Review of Systems Review of Systems  Constitutional: Negative for activity change, chills and fever.  Respiratory: Negative for cough, shortness of breath and wheezing.   Cardiovascular: Positive for chest pain.  Gastrointestinal: Negative for abdominal pain, constipation, nausea and vomiting.  Genitourinary: Negative for dysuria, flank pain, hematuria and urgency.  Musculoskeletal: Positive for arthralgias and myalgias. Negative for back pain, neck pain and neck stiffness.  Skin: Negative for color change and rash.  Neurological: Negative for weakness and light-headedness.     Physical Exam Updated Vital Signs BP (!) 137/93 (BP Location: Right Arm)   Pulse 71   Temp 98 F (36.7 C)   Resp 16   Ht 5\' 6"  (1.676 m)   Wt 86.2 kg   SpO2 100%   BMI 30.67 kg/m   Physical Exam Vitals signs and nursing note reviewed.  Constitutional:      Appearance: He is well-developed.  HENT:     Head: Normocephalic.  Eyes:     Conjunctiva/sclera: Conjunctivae normal.  Neck:     Musculoskeletal: Normal range of motion and neck supple.  Cardiovascular:     Rate and Rhythm: Normal rate and regular rhythm.  Heart sounds: Normal heart sounds. No murmur. No friction rub. No gallop.   Pulmonary:     Effort: Pulmonary effort is normal.     Breath sounds: Normal breath sounds. No rhonchi or rales.     Comments: Diffusely tender to palpation along the left lateral and posterior ribs, maximal tenderness at the left lateral ribs.  No point tenderness to palpation.  No crepitus or step-offs.  Good and equal chest expansion with inspiration.  Lungs are clear to auscultation bilaterally.  Skin to the overlying area is clear without erythema, edema, warmth, or rash. Chest:     Chest  wall: Tenderness present.  Abdominal:     General: There is no distension.     Palpations: Abdomen is soft.  Skin:    General: Skin is warm and dry.  Neurological:     Mental Status: He is alert.  Psychiatric:        Behavior: Behavior normal.      ED Treatments / Results  Labs (all labs ordered are listed, but only abnormal results are displayed) Labs Reviewed - No data to display  EKG None  Radiology Dg Chest 2 View  Result Date: 04/25/2018 CLINICAL DATA:  Left-sided chest pain.  No known injury. EXAM: CHEST - 2 VIEW COMPARISON:  12/23/2017 FINDINGS: The heart size and mediastinal contours are within normal limits. Both lungs are clear. The visualized skeletal structures are unremarkable. IMPRESSION: No active cardiopulmonary disease. Electronically Signed   By: Burman NievesWilliam  Stevens M.D.   On: 04/25/2018 06:59    Procedures Procedures (including critical care time)  Medications Ordered in ED Medications  ketorolac (TORADOL) injection 60 mg (60 mg Intramuscular Given 04/25/18 0720)     Initial Impression / Assessment and Plan / ED Course  I have reviewed the triage vital signs and the nursing notes.  Pertinent labs & imaging results that were available during my care of the patient were reviewed by me and considered in my medical decision making (see chart for details).     27 year old male with no pertinent past medical history presenting with left chest wall pain for the last 1 to 2 weeks.  On exam, he has reproducible tenderness to palpation.  Lungs are clear to auscultation bilaterally.  Heart is regular rate and rhythm.  There is no overlying rash.  Chest x-ray is unremarkable.  Toradol given in the ED and on reevaluation, the patient has markedly improved.  Suspect musculoskeletal injury.  Low suspicion for herpes zoster, pneumothorax, pneumonia, ACS.  Prior to discharge he is hemodynamically stable and in no acute distress.  He is safe discharged home with outpatient  follow-up at this time.  Final Clinical Impressions(s) / ED Diagnoses   Final diagnoses:  Left-sided chest wall pain    ED Discharge Orders         Ordered    methocarbamol (ROBAXIN) 500 MG tablet  2 times daily     04/25/18 0735    ibuprofen (ADVIL,MOTRIN) 600 MG tablet  Every 6 hours PRN     04/25/18 0735           Frederik PearMcDonald, Mia A, PA-C 04/25/18 1747    Ward, Layla MawKristen N, DO 04/25/18 2324

## 2018-04-25 NOTE — ED Triage Notes (Signed)
The pt is c/o lt lateral chest pain for one  To two weeks.  The pain is worse with movement and breathing  No rash

## 2018-04-25 NOTE — Discharge Instructions (Signed)
Thank you for allowing me to care for you today in the Emergency Department.   You can take 600 mg of ibuprofen with food every 6 hours or 650 mg of Tylenol for pain control.  If you know that your symptoms will be worse at work, you can take a dose prior to going into your job.  You can also apply ice or heat for 15 to 20 minutes to try and improve your pain.  Do not apply ice or heat directly to the skin.  Make sure to use a cloth or some kind of barrier between that and the skin.  Robaxin is a muscle relaxer.  You can take 1 tablet 2 times daily to help with muscle pain or spasms.  Do not work or drive until you know how this medication impacts you because it may make you drowsy.  Start to stretch the muscles of your chest wall as your pain allows.  Call the number on your discharge paperwork to schedule a follow-up appointment to get established with primary care.  You should follow-up if your symptoms do not start to improve in the next 1 to 2 weeks.  Return to the emergency department if you develop significant shortness of breath, chest pain with a high fever, if you develop a rash over the area, or other new, concerning symptoms.

## 2019-03-28 ENCOUNTER — Other Ambulatory Visit: Payer: Self-pay

## 2019-03-28 DIAGNOSIS — Z20822 Contact with and (suspected) exposure to covid-19: Secondary | ICD-10-CM

## 2019-03-31 LAB — NOVEL CORONAVIRUS, NAA: SARS-CoV-2, NAA: NOT DETECTED

## 2019-05-30 ENCOUNTER — Ambulatory Visit: Payer: Managed Care, Other (non HMO) | Attending: Internal Medicine

## 2019-05-30 DIAGNOSIS — Z20822 Contact with and (suspected) exposure to covid-19: Secondary | ICD-10-CM

## 2019-05-31 LAB — NOVEL CORONAVIRUS, NAA: SARS-CoV-2, NAA: NOT DETECTED

## 2019-08-14 ENCOUNTER — Telehealth: Payer: Self-pay | Admitting: Family Medicine

## 2019-08-14 NOTE — Telephone Encounter (Signed)
Called pt LVM for him to call back / no details/ pt is needing to set up an appt for check up needs TOC first

## 2019-10-15 IMAGING — DX DG CHEST 2V
2 series · 2 of 2 positions shown · non-contrast
Comparison: 12/23/2017

CLINICAL DATA: Left-sided chest pain.  No known injury.

EXAM:
CHEST - 2 VIEW

[w chest pa]
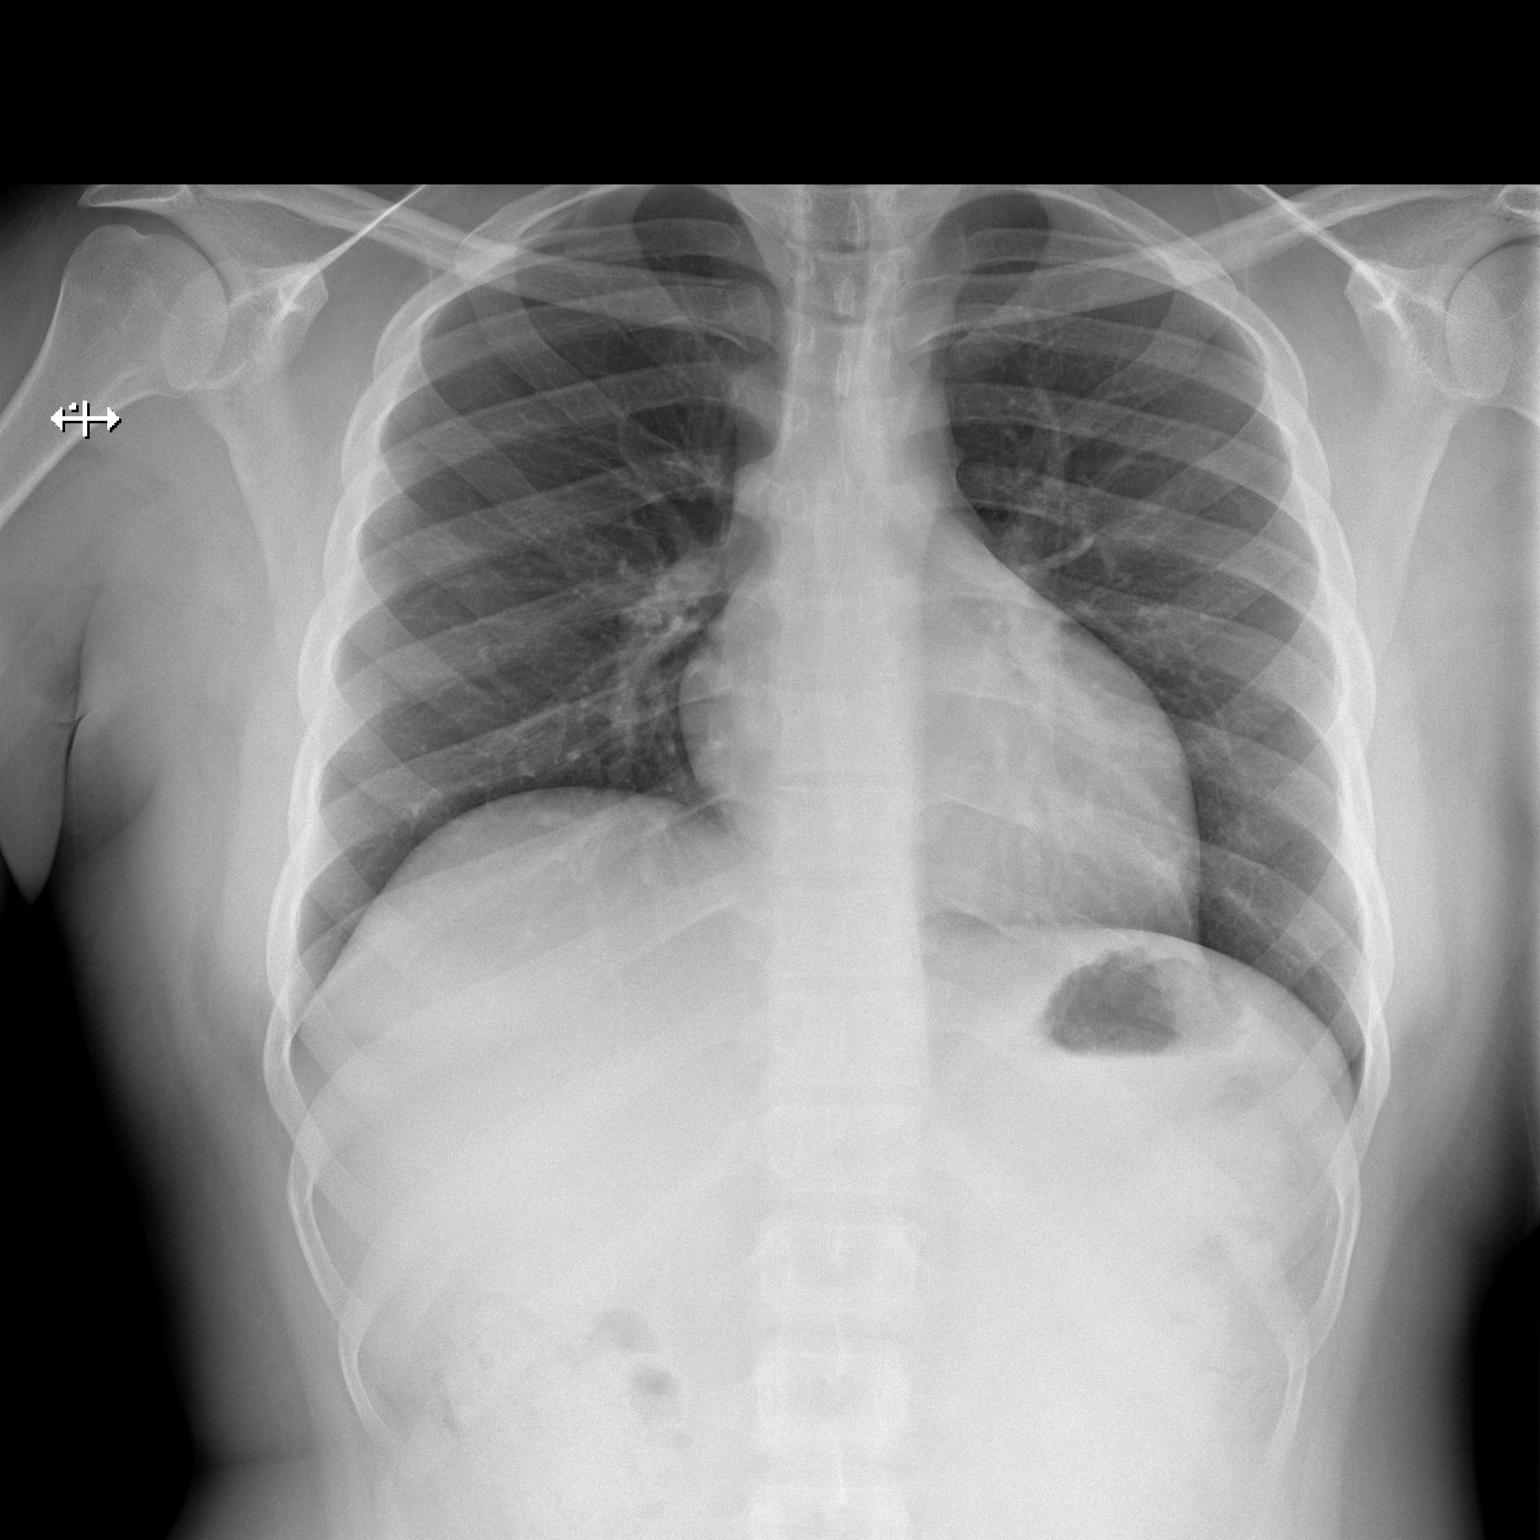

[w chest lat]
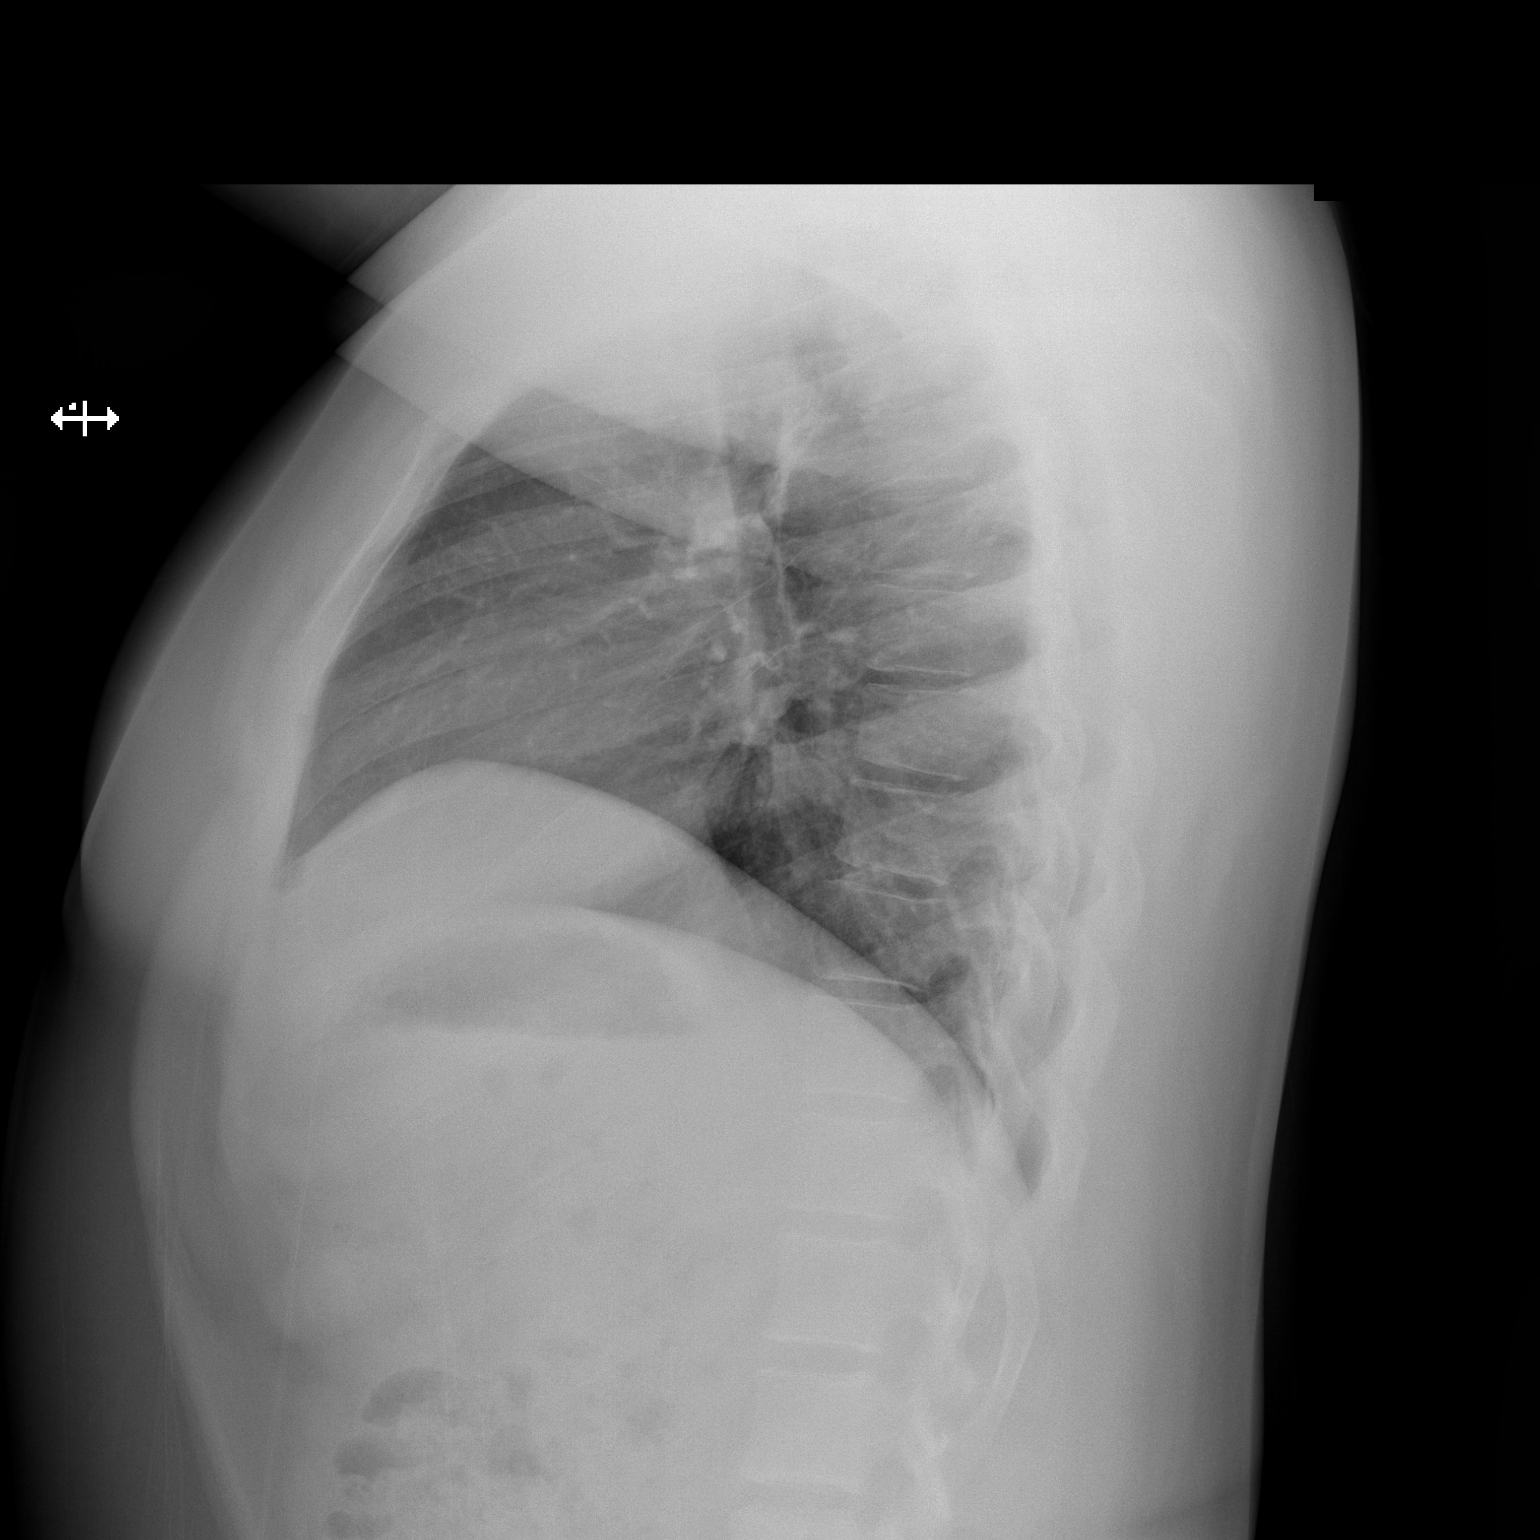

[2 of 2 positions shown; findings below may reference images not displayed]

FINDINGS: The heart size and mediastinal contours are within normal limits.
Both lungs are clear. The visualized skeletal structures are
unremarkable.
IMPRESSION: No active cardiopulmonary disease.

## 2020-05-05 ENCOUNTER — Other Ambulatory Visit: Payer: No Typology Code available for payment source

## 2020-05-05 DIAGNOSIS — Z20822 Contact with and (suspected) exposure to covid-19: Secondary | ICD-10-CM

## 2020-05-06 LAB — NOVEL CORONAVIRUS, NAA: SARS-CoV-2, NAA: NOT DETECTED

## 2020-05-06 LAB — SARS-COV-2, NAA 2 DAY TAT

## 2020-12-14 DIAGNOSIS — H6090 Unspecified otitis externa, unspecified ear: Secondary | ICD-10-CM | POA: Diagnosis not present

## 2020-12-14 DIAGNOSIS — R6883 Chills (without fever): Secondary | ICD-10-CM | POA: Diagnosis not present

## 2020-12-14 DIAGNOSIS — Z8616 Personal history of COVID-19: Secondary | ICD-10-CM | POA: Diagnosis not present

## 2020-12-14 DIAGNOSIS — R519 Headache, unspecified: Secondary | ICD-10-CM | POA: Diagnosis not present

## 2021-01-04 DIAGNOSIS — M26623 Arthralgia of bilateral temporomandibular joint: Secondary | ICD-10-CM | POA: Diagnosis not present

## 2021-01-04 DIAGNOSIS — L299 Pruritus, unspecified: Secondary | ICD-10-CM | POA: Diagnosis not present

## 2021-02-14 DIAGNOSIS — E7800 Pure hypercholesterolemia, unspecified: Secondary | ICD-10-CM | POA: Diagnosis not present

## 2021-02-14 DIAGNOSIS — Z0000 Encounter for general adult medical examination without abnormal findings: Secondary | ICD-10-CM | POA: Diagnosis not present

## 2021-08-17 DIAGNOSIS — I10 Essential (primary) hypertension: Secondary | ICD-10-CM | POA: Diagnosis not present

## 2021-08-17 DIAGNOSIS — E782 Mixed hyperlipidemia: Secondary | ICD-10-CM | POA: Diagnosis not present

## 2022-08-08 DIAGNOSIS — I10 Essential (primary) hypertension: Secondary | ICD-10-CM | POA: Diagnosis not present

## 2022-08-08 DIAGNOSIS — Z1331 Encounter for screening for depression: Secondary | ICD-10-CM | POA: Diagnosis not present

## 2022-08-08 DIAGNOSIS — H6123 Impacted cerumen, bilateral: Secondary | ICD-10-CM | POA: Diagnosis not present

## 2022-08-08 DIAGNOSIS — H6002 Abscess of left external ear: Secondary | ICD-10-CM | POA: Diagnosis not present

## 2022-08-08 DIAGNOSIS — H60543 Acute eczematoid otitis externa, bilateral: Secondary | ICD-10-CM | POA: Diagnosis not present

## 2022-08-08 DIAGNOSIS — R030 Elevated blood-pressure reading, without diagnosis of hypertension: Secondary | ICD-10-CM | POA: Diagnosis not present

## 2022-08-31 ENCOUNTER — Encounter (HOSPITAL_COMMUNITY): Payer: Self-pay

## 2022-08-31 ENCOUNTER — Emergency Department (HOSPITAL_COMMUNITY)
Admission: EM | Admit: 2022-08-31 | Discharge: 2022-08-31 | Disposition: A | Discharge status: Home / Self Care | Payer: BC Managed Care – PPO | Attending: Emergency Medicine | Admitting: Emergency Medicine

## 2022-08-31 DIAGNOSIS — H60501 Unspecified acute noninfective otitis externa, right ear: Secondary | ICD-10-CM | POA: Insufficient documentation

## 2022-08-31 DIAGNOSIS — H9201 Otalgia, right ear: Secondary | ICD-10-CM | POA: Diagnosis not present

## 2022-08-31 MED ORDER — CIPROFLOXACIN-DEXAMETHASONE 0.3-0.1 % OT SUSP: 4.0000 [drp] | Freq: Two times a day (BID) | OTIC | 0 refills | Status: AC

## 2022-08-31 MED ORDER — CIPROFLOXACIN-DEXAMETHASONE 0.3-0.1 % OT SUSP: 4.0000 [drp] | Freq: Two times a day (BID) | OTIC | 0 refills | Status: DC

## 2022-08-31 NOTE — ED Notes (Signed)
Assumed care of patient. Pt states he is having ear pain since yesterday. PT is Caox4 and in on obvious distress.

## 2022-08-31 NOTE — ED Provider Notes (Signed)
[place] EMERGENCY DEPARTMENT AT [facility] Provider Note   CSN: [id number] Arrival date & time: 08/31/22  0848     History  Chief Complaint  Patient presents with   Otalgia    Marcus Hanson. is a 32 y.o. male.  The history is provided by the patient and medical records. No language interpreter was used.  Otalgia    33 year old male who presents with complaints of ear discomfort.  Patient report for the past several years she has had ear dermatitis which causes itchiness to his ear.  He used Q-tips on occasion to help relieve the discomfort.  For the past 2 days he has had progressive worsening pain to his right ear.  Increasing pain with any kind of pressure or touching the ear.  I also endorsed having some cold chills.  He tries taking ibuprofen with some relief.  He denies any hearing changes no runny nose sneezing coughing sore throat headache or fever.  He denies any recent trauma.  He denies any dental pain.  Home Medications Prior to Admission medications   Medication Sig Start Date End Date Taking? Authorizing Provider  ibuprofen (ADVIL,MOTRIN) 600 MG tablet Take 1 tablet (600 mg total) by mouth every 6 (six) hours as needed. 04/25/18   McDonald, Mia A, PA-C  methocarbamol (ROBAXIN) 500 MG tablet Take 1 tablet (500 mg total) by mouth 2 (two) times daily. 04/25/18   McDonald, Mia A, PA-C      Allergies    Patient has no known allergies.    Review of Systems   Review of Systems  HENT:  Positive for ear pain.   All other systems reviewed and are negative.   Physical Exam Updated Vital Signs BP (!) 154/105 (BP Location: Left Arm)   Pulse 80   Temp 98.6 F (37 C) (Oral)   Resp 16   Ht  (1.676 m)   Wt 84.8 kg   SpO2 100%   BMI 30.18 kg/m  Physical Exam Vitals and nursing note reviewed.  Constitutional:      General: He is not in acute distress.    Appearance: He is well-developed.  HENT:     Head: Atraumatic.     Ears:     Comments:  Right ear: Tenderness to manipulation of the earlobe and the tragus without any overt overlying skin changes.  Ear canal is edematous and tender to palpation especially with the insertion of the otoscope.  TM is normal and intact.  No foreign body noted.  Left ear with normal appearance and nontender and intact TM.     Mouth/Throat:     Comments: No significant dental decay no reproducible dental pain.  No trismus. Eyes:     Conjunctiva/sclera: Conjunctivae normal.  Musculoskeletal:     Cervical back: Neck supple.  Skin:    Findings: No rash.  Neurological:     Mental Status: He is alert.     ED Results / Procedures / Treatments   Labs (all labs ordered are listed, but only abnormal results are displayed) Labs Reviewed - No data to display  EKG None  Radiology No results found.  Procedures Procedures    Medications Ordered in ED Medications - No data to display  ED Course/ Medical Decision Making/ A&P                             Medical Decision Making  BP (!) 154/105 (  BP Location: Left Arm)   Pulse 80   Temp 98.6 F (37 C) (Oral)   Resp 16   Ht  (1.676 m)   Wt 84.8 kg   SpO2 100%   BMI 30.18 kg/m   7:15 AM  32 year old male who presents with complaints of ear discomfort.  Patient report for the past several years she has had ear dermatitis which causes itchiness to his ear.  He used Q-tips on occasion to help relieve the discomfort.  For the past 2 days he has had progressive worsening pain to his right ear.  Increasing pain with any kind of pressure or touching the ear.  I also endorsed having some cold chills.  He tries taking ibuprofen with some relief.  He denies any hearing changes no runny nose sneezing coughing sore throat headache or fever.  He denies any recent trauma.  He denies any dental pain.  On exam this is a well-appearing male resting comfortably in the bed appears to be in no acute discomfort.  Exam is remarkable for tenderness to  palpation of the right earlobe and an edematous right ear canal with intact normal TM.  Finding consistent with otitis externa.  No evidence to suggest mastoiditis, no trauma to suggest fracture or dislocation no malocclusion of the jaw no dental tenderness to suggest odontogenic cause  Vital signs notable for elevated blood pressure of 154/105 likely in the setting of pain.  Plan to prescribe Ciprodex eardrops as treatment is otitis externa.  Patient may take ibuprofen for pain.  Return precaution given.  Advanced imaging including maxillofacial CT scan was considered but not performed as I have low suspicion for deep tissue infection.  I also have low suspicion for fracture or trauma.        Final Clinical Impression(s) / ED Diagnoses Final diagnoses:  Acute otitis externa of right ear, unspecified type    Rx / DC Orders ED Discharge Orders          Ordered    ciprofloxacin-dexamethasone (CIPRODEX) OTIC suspension  2 times daily        08/31/22 0920              Fayrene Helper, PA-C 08/31/22 1191    Jacalyn Lefevre, MD 08/31/22 1442

## 2022-08-31 NOTE — ED Triage Notes (Signed)
Pt c/o bilateral ear pain that has been going on and off for years, he was seen at specialist in 2022.  PCP dx pt with dermatitis in the ear- referred to ear dr.  Pt c/o itching in his ears  that caused him to leave work yesterday. Pt c/o chills and cold sweats.   Pt states relief with ibuprofen.
